# Patient Record
Sex: Male | Born: 1985
Health system: Southern US, Community
[De-identification: ages and names within clinical notes are randomized; demographics above are authoritative.]

## PROBLEM LIST (undated history)

## (undated) HISTORY — PX: APPENDECTOMY: SHX54

---

## 2012-02-20 ENCOUNTER — Emergency Department (HOSPITAL_COMMUNITY)
Admission: EM | Admit: 2012-02-20 | Discharge: 2012-02-21 | Disposition: A | Discharge status: Home / Self Care | Payer: Worker's Compensation | Attending: Emergency Medicine | Admitting: Emergency Medicine

## 2012-02-20 ENCOUNTER — Encounter (HOSPITAL_COMMUNITY): Payer: Self-pay | Admitting: Family Medicine

## 2012-02-20 DIAGNOSIS — W268XXA Contact with other sharp object(s), not elsewhere classified, initial encounter: Secondary | ICD-10-CM | POA: Insufficient documentation

## 2012-02-20 DIAGNOSIS — Z23 Encounter for immunization: Secondary | ICD-10-CM | POA: Insufficient documentation

## 2012-02-20 DIAGNOSIS — IMO0001 Reserved for inherently not codable concepts without codable children: Secondary | ICD-10-CM

## 2012-02-20 DIAGNOSIS — S61209A Unspecified open wound of unspecified finger without damage to nail, initial encounter: Secondary | ICD-10-CM | POA: Insufficient documentation

## 2012-02-20 NOTE — ED Notes (Signed)
Pt has grease on hands and area cleaned as best as possible at this time. New bandage applied. Bleeding controlled.

## 2012-02-20 NOTE — ED Notes (Signed)
Pt states he took two small ibuprofen after event happened.

## 2012-02-20 NOTE — ED Notes (Signed)
Pt reports he was at work and cut his right ring and middle fingers on a truck. Bleeding controlled at this time. Pain rated 4/10. Pt states he needs a drug screen done for work.

## 2012-02-21 ENCOUNTER — Emergency Department (HOSPITAL_COMMUNITY): Payer: Worker's Compensation

## 2012-02-21 MED ORDER — TETANUS-DIPHTH-ACELL PERTUSSIS 5-2.5-18.5 LF-MCG/0.5 IM SUSP
0.5000 mL | Freq: Once | INTRAMUSCULAR | Status: AC
  Administered 2012-02-21: 0.5 mL via INTRAMUSCULAR
  Filled 2012-02-21: qty 0.5

## 2012-02-21 MED ORDER — AMOXICILLIN-POT CLAVULANATE 875-125 MG PO TABS: 1.0000 | ORAL_TABLET | Freq: Two times a day (BID) | ORAL | Status: AC

## 2012-02-21 NOTE — ED Notes (Signed)
Patient is alert and oriented x3.  He was given DC instructions and follow up visit instructions.  Patient gave verbal understanding.  He was DC ambulatory under his own power to home.  V/S stable.  He was not showing any signs of distress on DC 

## 2012-02-21 NOTE — ED Provider Notes (Signed)
Medical screening examination/treatment/procedure(s) were performed by non-physician practitioner and as supervising physician I was immediately available for consultation/collaboration.    Vida Roller, MD 02/21/12 [phone]

## 2012-02-21 NOTE — ED Provider Notes (Signed)
History     CSN: [id number]  Arrival date & time 02/20/12  2205   First MD Initiated Contact with Patient 02/21/12 0113      Chief Complaint  Patient presents with  . Extremity Laceration    (Consider location/radiation/quality/duration/timing/severity/associated sxs/prior treatment) HPI The patient presents to the emergency department with lacerations to his third and fourth digits following an injury at work.  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  Patient denies numbness or weakness in the fingers.  Patient, states he has full range of motion and strength in his hand and fingers.  Patient denies nausea, or vomiting.  Patient did not take anything prior to arrival, for his discomfort. History reviewed. No pertinent past medical history.  Past Surgical History  Procedure Date  . Appendectomy     History reviewed. No pertinent family history.  History  Substance Use Topics  . Smoking status: Never Smoker   . Smokeless tobacco: Not on file  . Alcohol Use: Yes     occasionally      Review of Systems All other systems negative except as documented in the HPI. All pertinent positives and negatives as reviewed in the HPI.  Allergies  Review of patient's allergies indicates no known allergies.  Home Medications   Current Outpatient Rx  Name Route Sig Dispense Refill  . IBUPROFEN 200 MG PO TABS Oral Take 400 mg by mouth every 8 (eight) hours as needed. For pain      BP 113/67  Pulse 97  Temp 98.8 F (37.1 C) (Oral)  Resp 18  SpO2 98%  Physical Exam  Nursing note and vitals reviewed. Constitutional: He appears well-developed and well-nourished.  HENT:  Head: Normocephalic and atraumatic.  Pulmonary/Chest: Effort normal.  Musculoskeletal:       Right hand: He exhibits laceration. He exhibits normal range of motion, normal two-point discrimination and normal capillary refill. normal sensation noted. Normal strength  noted.       Hands: Skin: Skin is warm and dry.    ED Course  Procedures (including critical care time)  Labs Reviewed - No data to display Dg Hand Complete Right  02/21/2012  *RADIOLOGY REPORT*  Clinical Data: Hand laceration.  RIGHT HAND - COMPLETE 3+ VIEW  Comparison: None.  Findings: There is a small linear density medial to the middle phalanx third digit.  No acute fracture or dislocation otherwise. No aggressive osseous lesion.  IMPRESSION: Small linear density projecting medial to the middle phalanx third digit.  May be artifactual or a small foreign body.  Correlate with location of the injury.   Original Report Authenticated By: Waneta Martins, M.D.      LACERATION REPAIR Performed by: Carlyle Dolly Authorized by: Carlyle Dolly Consent: Verbal consent obtained. Risks and benefits: risks, benefits and alternatives were discussed Consent given by: patient Patient identity confirmed: provided demographic data Prepped and Draped in normal sterile fashion Wound explored  Laceration Location: DIP on the dorsal aspect of the third digit  Laceration Length: 2cm  No Foreign Bodies seen or palpated  Anesthesia: local infiltration  Local anesthetic: lidocaine 2 % without epinephrine  Anesthetic total: 5 ml  Irrigation method: syringe Amount of cleaning: standard  Skin closure: 4-0 Prolene   Number of sutures: 3   Technique: Simple, interrupted   Patient tolerance: Patient tolerated the procedure well with no immediate complications.  Wound was scrubbed and cleansed of dirt and grease-like material  LACERATION REPAIR Performed  by: Keiton Cosma W Authorized by: Carlyle Dolly Consent: Verbal consent obtained. Risks and benefits: risks, benefits and alternatives were discussed Consent given by: patient Patient identity confirmed: provided demographic data Prepped and Draped in normal sterile fashion Wound explored  Laceration Location:  Dorsal aspect of the DIP on the fourth digit right hand  Laceration Length: 2.5 cm  No Foreign Bodies seen or palpated  Anesthesia: local infiltration  Local anesthetic: lidocaine 2 % without epinephrine  Anesthetic total: 5 ml  Irrigation method: syringe Amount of cleaning: standard  Skin closure: 4-0 Prolene   Number of sutures: 3   Technique: Simple, interrupted   Patient tolerance: Patient tolerated the procedure well with no immediate complications.Wound was scrubbed and cleansed of dirt and grease-like material  The wounds were explored and there were some grease-like material was removed from each wound is no signs of tendon disruption or injury on exploration    MDM          Carlyle Dolly, PA-C 02/21/12 0353  Carlyle Dolly, PA-C 02/21/12 [id number]

## 2012-02-21 NOTE — ED Notes (Signed)
Wound cleaned.

## 2012-08-30 ENCOUNTER — Ambulatory Visit: Payer: Commercial Managed Care - PPO

## 2012-08-30 ENCOUNTER — Ambulatory Visit (INDEPENDENT_AMBULATORY_CARE_PROVIDER_SITE_OTHER): Payer: Commercial Managed Care - PPO | Admitting: Emergency Medicine

## 2012-08-30 DIAGNOSIS — S93409A Sprain of unspecified ligament of unspecified ankle, initial encounter: Secondary | ICD-10-CM

## 2012-08-30 MED ORDER — HYDROCODONE-ACETAMINOPHEN 5-325 MG PO TABS: 1.0000 | ORAL_TABLET | Freq: Four times a day (QID) | ORAL | Status: AC | PRN

## 2012-08-30 NOTE — Progress Notes (Signed)
  Subjective:    Patient ID: Ryan Cox, male    DOB: 1985/10/18, 27 y.o.   MRN: [id number]  HPI complains of "rolling ankle" 4 days ago. Inversion of the ankle after slipping off top step and exerting full body weight on it. Has been wearing a brace since it happened, but it has not improved. Able to bear weight, but it is painful. Also claims it pops when the ankle moves and that hurts the worst. Pain is worst at the posterior part of the later malleolus. It was swollen with bruising last night. Has been icing it. Taking ibuprofen as well, which has not helped.      Review of Systems  Constitutional: Negative.   Respiratory: Negative.   Cardiovascular: Negative.   Musculoskeletal: Positive for joint swelling, arthralgias and gait problem.       Objective:   Physical Exam  Constitutional: He appears well-developed and well-nourished.  Cardiovascular: Normal rate, regular rhythm and normal heart sounds.   Pulmonary/Chest: Effort normal and breath sounds normal.  Musculoskeletal:       Left ankle: He exhibits decreased range of motion, swelling and ecchymosis. He exhibits no deformity, no laceration and normal pulse. Achilles tendon normal.       Feet:  Tenderness around left malleolus. Mild ecchymosis below malleolus. Mild pain with inversion, eversion, and plantarflexion. Severe pain with dorsiflexion.     [facility], read by Dr. Cleta Alberts: L ankle: normal.      Assessment & Plan:  27 yo male presenting with L ankle injury. Left ankle sprain. Advise to continue wearing brace and take ibuprofen. Advise to soak in warm water with epsom salt, as well as write out alphabet with big toe. Given pain medicine for use at nighttime only. Given a work note for today, and he will hopefully be able to return Monday after resting it this weekend.

## 2012-08-30 NOTE — Patient Instructions (Addendum)
Ankle Sprain  An ankle sprain is an injury to the strong, fibrous tissues (ligaments) that hold the bones of your ankle joint together.   CAUSES  An ankle sprain is usually caused by a fall or by twisting your ankle. Ankle sprains most commonly occur when you step on the outer edge of your foot, and your ankle turns inward. People who participate in sports are more prone to these types of injuries.   SYMPTOMS    Pain in your ankle. The pain may be present at rest or only when you are trying to stand or walk.   Swelling.   Bruising. Bruising may develop immediately or within 1 to 2 days after your injury.   Difficulty standing or walking, particularly when turning corners or changing directions.  DIAGNOSIS   Your caregiver will ask you details about your injury and perform a physical exam of your ankle to determine if you have an ankle sprain. During the physical exam, your caregiver will press on and apply pressure to specific areas of your foot and ankle. Your caregiver will try to move your ankle in certain ways. An X-ray exam may be done to be sure a bone was not broken or a ligament did not separate from one of the bones in your ankle (avulsion fracture).   TREATMENT   Certain types of braces can help stabilize your ankle. Your caregiver can make a recommendation for this. Your caregiver may recommend the use of medicine for pain. If your sprain is severe, your caregiver may refer you to a surgeon who helps to restore function to parts of your skeletal system (orthopedist) or a physical therapist.  HOME CARE INSTRUCTIONS    Apply ice to your injury for 1 to 2 days or as directed by your caregiver. Applying ice helps to reduce inflammation and pain.   Put ice in a plastic bag.   Place a towel between your skin and the bag.   Leave the ice on for 15 to 20 minutes at a time, every 2 hours while you are awake.   Only take over-the-counter or prescription medicines for pain, discomfort, or fever as directed  by your caregiver.   Keep your injured leg elevated, when possible, to lessen swelling.   If your caregiver recommends crutches, use them as instructed. Gradually put weight on the affected ankle. Continue to use crutches or a cane until you can walk without feeling pain in your ankle.   If you have a plaster splint, wear the splint as directed by your caregiver. Do not rest it on anything harder than a pillow for the first 24 hours. Do not put weight on it. Do not get it wet. You may take it off to take a shower or bath.   You may have been given an elastic bandage to wear around your ankle to provide support. If the elastic bandage is too tight (you have numbness or tingling in your foot or your foot becomes cold and blue), adjust the bandage to make it comfortable.   If you have an air splint, you may blow more air into it or let air out to make it more comfortable. You may take your splint off at night and before taking a shower or bath.   Wiggle your toes in the splint several times per day to decrease swelling.  SEEK MEDICAL CARE IF:    You have an increase in bruising, swelling, or pain.   Your toes feel extremely cold   or you lose feeling in your foot.   Your pain is not relieved with medicine.  SEEK IMMEDIATE MEDICAL CARE IF:   Your toes are numb or blue.   You have severe pain.  MAKE SURE YOU:    Understand these instructions.   Will watch your condition.   Will get help right away if you are not doing well or get worse.  Document Released: 05/08/2005 Document Revised: 07/31/2011 Document Reviewed: 05/20/2011  [employer] Patient Information 2013 [employer].

## 2014-11-14 IMAGING — CR DG ANKLE COMPLETE 3+V*L*
1 series · 1 of 1 positions shown · non-contrast
Comparison: None.

CLINICAL DATA: History of injury.

LEFT ANKLE COMPLETE - 3+ VIEW

[AP]
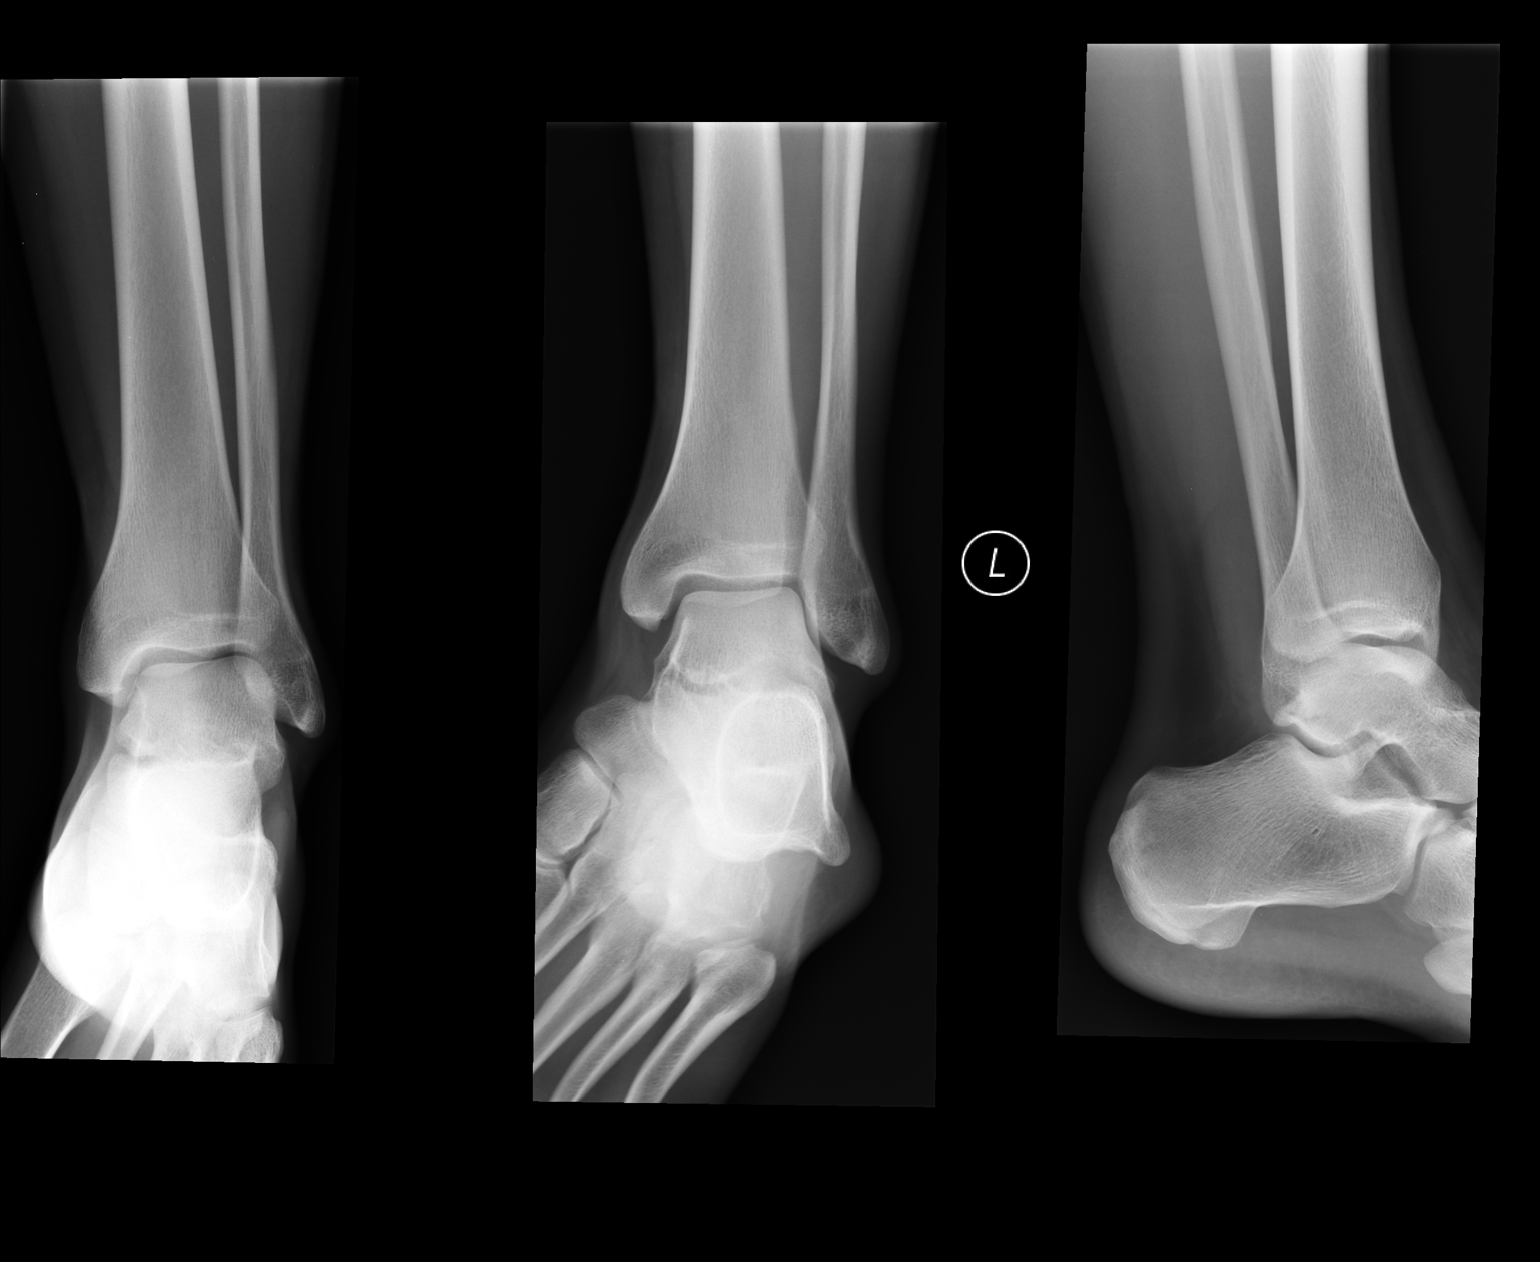

[1 of 1 positions shown; findings below may reference images not displayed]

FINDINGS: Alignment is normal.  Joint spaces are preserved.  No
fracture or dislocation is evident.  No soft tissue lesions are
seen.
IMPRESSION: No ankle abnormality is evident.

## 2019-09-07 ENCOUNTER — Other Ambulatory Visit: Payer: Self-pay

## 2019-09-07 ENCOUNTER — Encounter (HOSPITAL_COMMUNITY): Payer: Self-pay | Admitting: Emergency Medicine

## 2019-09-07 ENCOUNTER — Emergency Department (HOSPITAL_COMMUNITY): Payer: No Typology Code available for payment source

## 2019-09-07 ENCOUNTER — Emergency Department (HOSPITAL_COMMUNITY)
Admission: EM | Admit: 2019-09-07 | Discharge: 2019-09-07 | Disposition: A | Discharge status: Home / Self Care | Payer: No Typology Code available for payment source | Attending: Emergency Medicine | Admitting: Emergency Medicine

## 2019-09-07 DIAGNOSIS — R42 Dizziness and giddiness: Secondary | ICD-10-CM | POA: Insufficient documentation

## 2019-09-07 DIAGNOSIS — F17210 Nicotine dependence, cigarettes, uncomplicated: Secondary | ICD-10-CM | POA: Diagnosis not present

## 2019-09-07 DIAGNOSIS — R0602 Shortness of breath: Secondary | ICD-10-CM | POA: Diagnosis not present

## 2019-09-07 DIAGNOSIS — R0789 Other chest pain: Secondary | ICD-10-CM | POA: Insufficient documentation

## 2019-09-07 LAB — CBC
HCT: 44.6 % (ref 39.0–52.0)
Hemoglobin: 14.9 g/dL (ref 13.0–17.0)
MCH: 30.6 pg (ref 26.0–34.0)
MCHC: 33.4 g/dL (ref 30.0–36.0)
MCV: 91.6 fL (ref 80.0–100.0)
Platelets: 237 10*3/uL (ref 150–400)
RBC: 4.87 MIL/uL (ref 4.22–5.81)
RDW: 12.5 % (ref 11.5–15.5)
WBC: 6.7 10*3/uL (ref 4.0–10.5)
nRBC: 0 % (ref 0.0–0.2)

## 2019-09-07 LAB — BASIC METABOLIC PANEL
Anion gap: 9 (ref 5–15)
BUN: 15 mg/dL (ref 6–20)
CO2: 28 mmol/L (ref 22–32)
Calcium: 9.4 mg/dL (ref 8.9–10.3)
Chloride: 106 mmol/L (ref 98–111)
Creatinine, Ser: 0.87 mg/dL (ref 0.61–1.24)
GFR calc Af Amer: >60 mL/min (ref >60)
GFR calc non Af Amer: >60 mL/min (ref >60)
Glucose, Bld: 104 mg/dL — ABNORMAL HIGH (ref 70–99)
Potassium: 3.6 mmol/L (ref 3.5–5.1)
Sodium: 143 mmol/L (ref 135–145)

## 2019-09-07 LAB — TROPONIN I (HIGH SENSITIVITY)
Troponin I (High Sensitivity): <2 ng/L (ref <18)
Troponin I (High Sensitivity): <2 ng/L (ref <18)

## 2019-09-07 MED ORDER — SODIUM CHLORIDE 0.9 % IV BOLUS
1000.0000 mL | Freq: Once | INTRAVENOUS | Status: AC
  Administered 2019-09-07: 1000 mL via INTRAVENOUS

## 2019-09-07 MED ORDER — SODIUM CHLORIDE 0.9% FLUSH
3.0000 mL | Freq: Once | INTRAVENOUS | Status: AC
  Administered 2019-09-07: 3 mL via INTRAVENOUS

## 2019-09-07 NOTE — ED Provider Notes (Signed)
[facility] EMERGENCY DEPARTMENT Provider Note   CSN: [id number] Arrival date & time: 09/07/19  1424     History Chief Complaint  Patient presents with  . Chest Pain    Ryan Cox is a 34 y.o. male who presents to ED with a chief complaint of chest pain.  Yesterday morning, 5 minutes after he woke up approximately 36 hours ago started having squeezing central chest pain with associated shortness of breath.  Symptoms have been intermittent without specific aggravating or alleviating factor since it began.  He reports associated dizziness.  He denies history of similar symptoms in the past.  He has not tried any medications to help with the symptoms.  Reports normal activity and appetite level.  Denies any cough, fever, abdominal pain, vomiting, history of DVT or PE, MI, recent immobilization, leg swelling, exogenous hormone use or recent surgeries.  Does report family history of CAD in both grandparents.  HPI     History reviewed. No pertinent past medical history.  There are no problems to display for this patient.   Past Surgical History:  Procedure Laterality Date  . APPENDECTOMY         No family history on file.  Social History   Tobacco Use  . Smoking status: Current Some Day Smoker  . Smokeless tobacco: Never Used  Substance Use Topics  . Alcohol use: Yes    Comment: occasionally  . Drug use: No    Home Medications Prior to Admission medications   Medication Sig Start Date End Date Taking? Authorizing Provider  amoxicillin-clavulanate (AUGMENTIN) 875-125 MG per tablet Take 1 tablet by mouth 2 (two) times daily. 02/21/12   Lawyer, Cristal Deer, PA-C  HYDROcodone-acetaminophen (NORCO) 5-325 MG per tablet Take 1 tablet by mouth every 6 (six) hours as needed for pain. 08/30/12   Collene Gobble, MD  ibuprofen (ADVIL,MOTRIN) 200 MG tablet Take 400 mg by mouth every 8 (eight) hours as needed. For pain    [provider]    Allergies     Patient has no known allergies.  Review of Systems   Review of Systems  Constitutional: Negative for appetite change, chills and fever.  HENT: Negative for ear pain, rhinorrhea, sneezing and sore throat.   Eyes: Negative for photophobia and visual disturbance.  Respiratory: Positive for shortness of breath. Negative for cough, chest tightness and wheezing.   Cardiovascular: Positive for chest pain. Negative for palpitations.  Gastrointestinal: Negative for abdominal pain, blood in stool, constipation, diarrhea, nausea and vomiting.  Genitourinary: Negative for dysuria, hematuria and urgency.  Musculoskeletal: Negative for myalgias.  Skin: Negative for rash.  Neurological: Positive for dizziness. Negative for weakness and light-headedness.    Physical Exam Updated Vital Signs BP 104/73   Pulse (!) 53   Temp 98.7 F (37.1 C) (Oral)   Resp 12   Ht 6\' 2"  (1.88 m)   Wt 81.6 kg   SpO2 100%   BMI 23.11 kg/m   Physical Exam Vitals and nursing note reviewed.  Constitutional:      General: He is not in acute distress.    Appearance: He is well-developed.  HENT:     Head: Normocephalic and atraumatic.     Nose: Nose normal.  Eyes:     General: No scleral icterus.       Right eye: No discharge.        Left eye: No discharge.     Conjunctiva/sclera: Conjunctivae normal.  Cardiovascular:     Rate  and Rhythm: Normal rate and regular rhythm.     Heart sounds: Normal heart sounds. No murmur. No friction rub. No gallop.   Pulmonary:     Effort: Pulmonary effort is normal. No respiratory distress.     Breath sounds: Normal breath sounds.  Abdominal:     General: Bowel sounds are normal. There is no distension.     Palpations: Abdomen is soft.     Tenderness: There is no abdominal tenderness. There is no guarding.  Musculoskeletal:        General: Normal range of motion.     Cervical back: Normal range of motion and neck supple.     Right lower leg: No tenderness. No edema.      Left lower leg: No tenderness. No edema.     Comments: No lower extremity edema, erythema or calf tenderness bilaterally.  Skin:    General: Skin is warm and dry.     Findings: No rash.  Neurological:     Mental Status: He is alert. Mental status is at baseline.     Cranial Nerves: No cranial nerve deficit.     Motor: No abnormal muscle tone.     Coordination: Coordination normal.     ED Results / Procedures / Treatments   Labs (all labs ordered are listed, but only abnormal results are displayed) Labs Reviewed  BASIC METABOLIC PANEL - Abnormal; Notable for the following components:      Result Value   Glucose, Bld 104 (*)    All other components within normal limits  CBC  TROPONIN I (HIGH SENSITIVITY)  TROPONIN I (HIGH SENSITIVITY)    EKG EKG Interpretation  Date/Time:  Sunday September 07 2019 14:31:40 EDT Ventricular Rate:  69 PR Interval:  124 QRS Duration: 86 QT Interval:  356 QTC Calculation: 381 R Axis:   72 Text Interpretation: Normal sinus rhythm Normal ECG No prior ECG for comparison. No STEMI Confirmed by Tegeler, Chris ([id number]) on 09/07/2019 5:41:34 PM   Radiology DG Chest 2 View  Result Date: 09/07/2019 CLINICAL DATA:  33 year old male with chest pain. EXAM: CHEST - 2 VIEW COMPARISON:  None. FINDINGS: The heart size and mediastinal contours are within normal limits. Both lungs are clear. The visualized skeletal structures are unremarkable. IMPRESSION: No active cardiopulmonary disease. Electronically Signed   By: Arash  Radparvar M.D.   On: 09/07/2019 15:04    Procedures Procedures (including critical care time)  Medications Ordered in ED Medications  sodium chloride flush (NS) 0.9 % injection 3 mL (3 mLs Intravenous Given 09/07/19 1839)  sodium chloride 0.9 % bolus 1,000 mL (1,000 mLs Intravenous New Bag/Given 09/07/19 1838)    ED Course  I have reviewed the triage vital signs and the nursing notes.  Pertinent labs & imaging results that were available  during my care of the patient were reviewed by me and considered in my medical decision making (see chart for details).    MDM Rules/Calculators/A&P                      33  year old male presents to ED with a chief complaint of chest pain.  Yesterday morning, 5 minutes after he woke up approximately 36 hours ago started having squeezing central chest pain with associated shortness of breath and dizziness.  Symptoms have been intermittent without specific aggravating or alleviating factor.  Denies cough, fever, abdominal pain, vomiting, leg swelling, history of DVT or PE, recent immobilization.  On exam patient is overall well-appearing.  He is not experiencing pain on my evaluation.  No lower extremity edema, erythema or calf tenderness that concern me for DVT.  He is not tachycardic, tachypneic or hypoxic.  EKG shows normal sinus rhythm, no ischemic changes.  Chest x-ray is unremarkable.  BMP, CBC and serial troponins are unremarkable.  Patient was given IV fluids here.  At this time I have low suspicion for ACS as a cause of his symptoms based on today's work-up.  He is PERC negative so I doubt PE.  No evidence of pneumonia, pneumothorax on his exam.  As he is pain-free on my exam, no interventions were done.  I had a discussion with the patient and family member at bedside.  States that they are concerned about his dizziness.  Suspect that this could be due to dehydration versus anxiety.  Informed him that there are no signs of severe dehydration on today's lab work.  I did offer additional work-up including imaging or treatment with medication such as meclizine but they declined.  I doubt his dizziness is due to an acute neurological cause such as CVA or dissection. Encourage patient to follow-up with PCP and to return for worsening symptoms.  All imaging, if done today, including plain films, CT scans, and ultrasounds, independently reviewed by me, and interpretations confirmed via formal radiology  reads.  Patient is hemodynamically stable, in NAD, and able to ambulate in the ED. Evaluation does not show pathology that would require ongoing emergent intervention or inpatient treatment. I explained the diagnosis to the patient. Pain has been managed and has no complaints prior to discharge. Patient is comfortable with above plan and is stable for discharge at this time. All questions were answered prior to disposition. Strict return precautions for returning to the ED were discussed. Encouraged follow up with PCP.   An After Visit Summary was printed and given to the patient.   Portions of this note were generated with [employer]. Dictation errors may occur despite best attempts at proofreading.   Final Clinical Impression(s) / ED Diagnoses Final diagnoses:  Chest wall pain    Rx / DC Orders ED Discharge Orders    None       Delia Heady, PA-C 09/07/19 2027    Tegeler, Gwenyth Allegra, MD 09/08/19 [phone]

## 2019-09-07 NOTE — Discharge Instructions (Addendum)
Your work-up today including your EKG, heart enzymes, blood counts electrolytes were normal.  Your chest x-ray did not show any concerning findings. We are unable to explain why you are having the symptoms but were able to rule out life threatening causes. You will need to follow-up with your primary care provider. Return to the ED if you start to experience worsening chest pain, shortness of breath, leg swelling, injuries or falls or lightheadedness.

## 2019-09-07 NOTE — ED Triage Notes (Signed)
C/o squeezing pain to center of chest since yesterday with SOB and dizziness.

## 2019-11-11 ENCOUNTER — Other Ambulatory Visit (HOSPITAL_COMMUNITY): Payer: Self-pay | Admitting: Internal Medicine

## 2019-11-11 MED FILL — ESCITALOPRAM 10 MG TABLET: 10 | 90 days supply | Qty: 90 | Fill #0

## 2019-11-21 MED FILL — ESCITALOPRAM 10 MG TABLET: 10 | 90 days supply | Qty: 90 | Fill #0

## 2020-02-28 MED FILL — ESCITALOPRAM 10 MG TABLET: 10 | 90 days supply | Qty: 90 | Fill #1

## 2020-06-09 MED FILL — ESCITALOPRAM 10 MG TABLET: 10 | 90 days supply | Qty: 90 | Fill #2

## 2020-09-18 ENCOUNTER — Other Ambulatory Visit (HOSPITAL_COMMUNITY): Payer: Self-pay

## 2020-09-18 MED FILL — Escitalopram Oxalate Tab 10 MG (Base Equiv): ORAL | 90 days supply | Qty: 90 | Fill #0 | Status: AC

## 2021-06-07 ENCOUNTER — Other Ambulatory Visit (HOSPITAL_COMMUNITY): Payer: Self-pay

## 2021-06-07 MED ORDER — VENLAFAXINE HCL ER 75 MG PO CP24
75.0000 mg | ORAL_CAPSULE | Freq: Every day | ORAL | 1 refills | Status: DC
  Filled 2021-06-07 – 2021-06-20 (×2): qty 90, 90d supply, fill #0
  Filled 2021-09-29: qty 90, 90d supply, fill #1

## 2021-06-11 ENCOUNTER — Other Ambulatory Visit (HOSPITAL_COMMUNITY): Payer: Self-pay

## 2021-06-20 ENCOUNTER — Other Ambulatory Visit (HOSPITAL_COMMUNITY): Payer: Self-pay

## 2021-09-29 ENCOUNTER — Other Ambulatory Visit (HOSPITAL_COMMUNITY): Payer: Self-pay

## 2021-10-21 ENCOUNTER — Other Ambulatory Visit (HOSPITAL_COMMUNITY): Payer: Self-pay

## 2021-10-21 MED ORDER — VENLAFAXINE HCL ER 75 MG PO CP24
75.0000 mg | ORAL_CAPSULE | Freq: Every day | ORAL | 3 refills | Status: AC
  Filled 2021-10-21 – 2022-01-02 (×2): qty 90, 90d supply, fill #0
  Filled 2022-04-05: qty 90, 90d supply, fill #1
  Filled 2022-06-30: qty 90, 90d supply, fill #2
  Filled 2022-10-04: qty 90, 90d supply, fill #3

## 2021-11-21 IMAGING — DX DG CHEST 2V
2 series · 2 of 2 positions shown · non-contrast
Comparison: None.

CLINICAL DATA: 33-year-old male with chest pain.

EXAM:
CHEST - 2 VIEW

[chest pa]
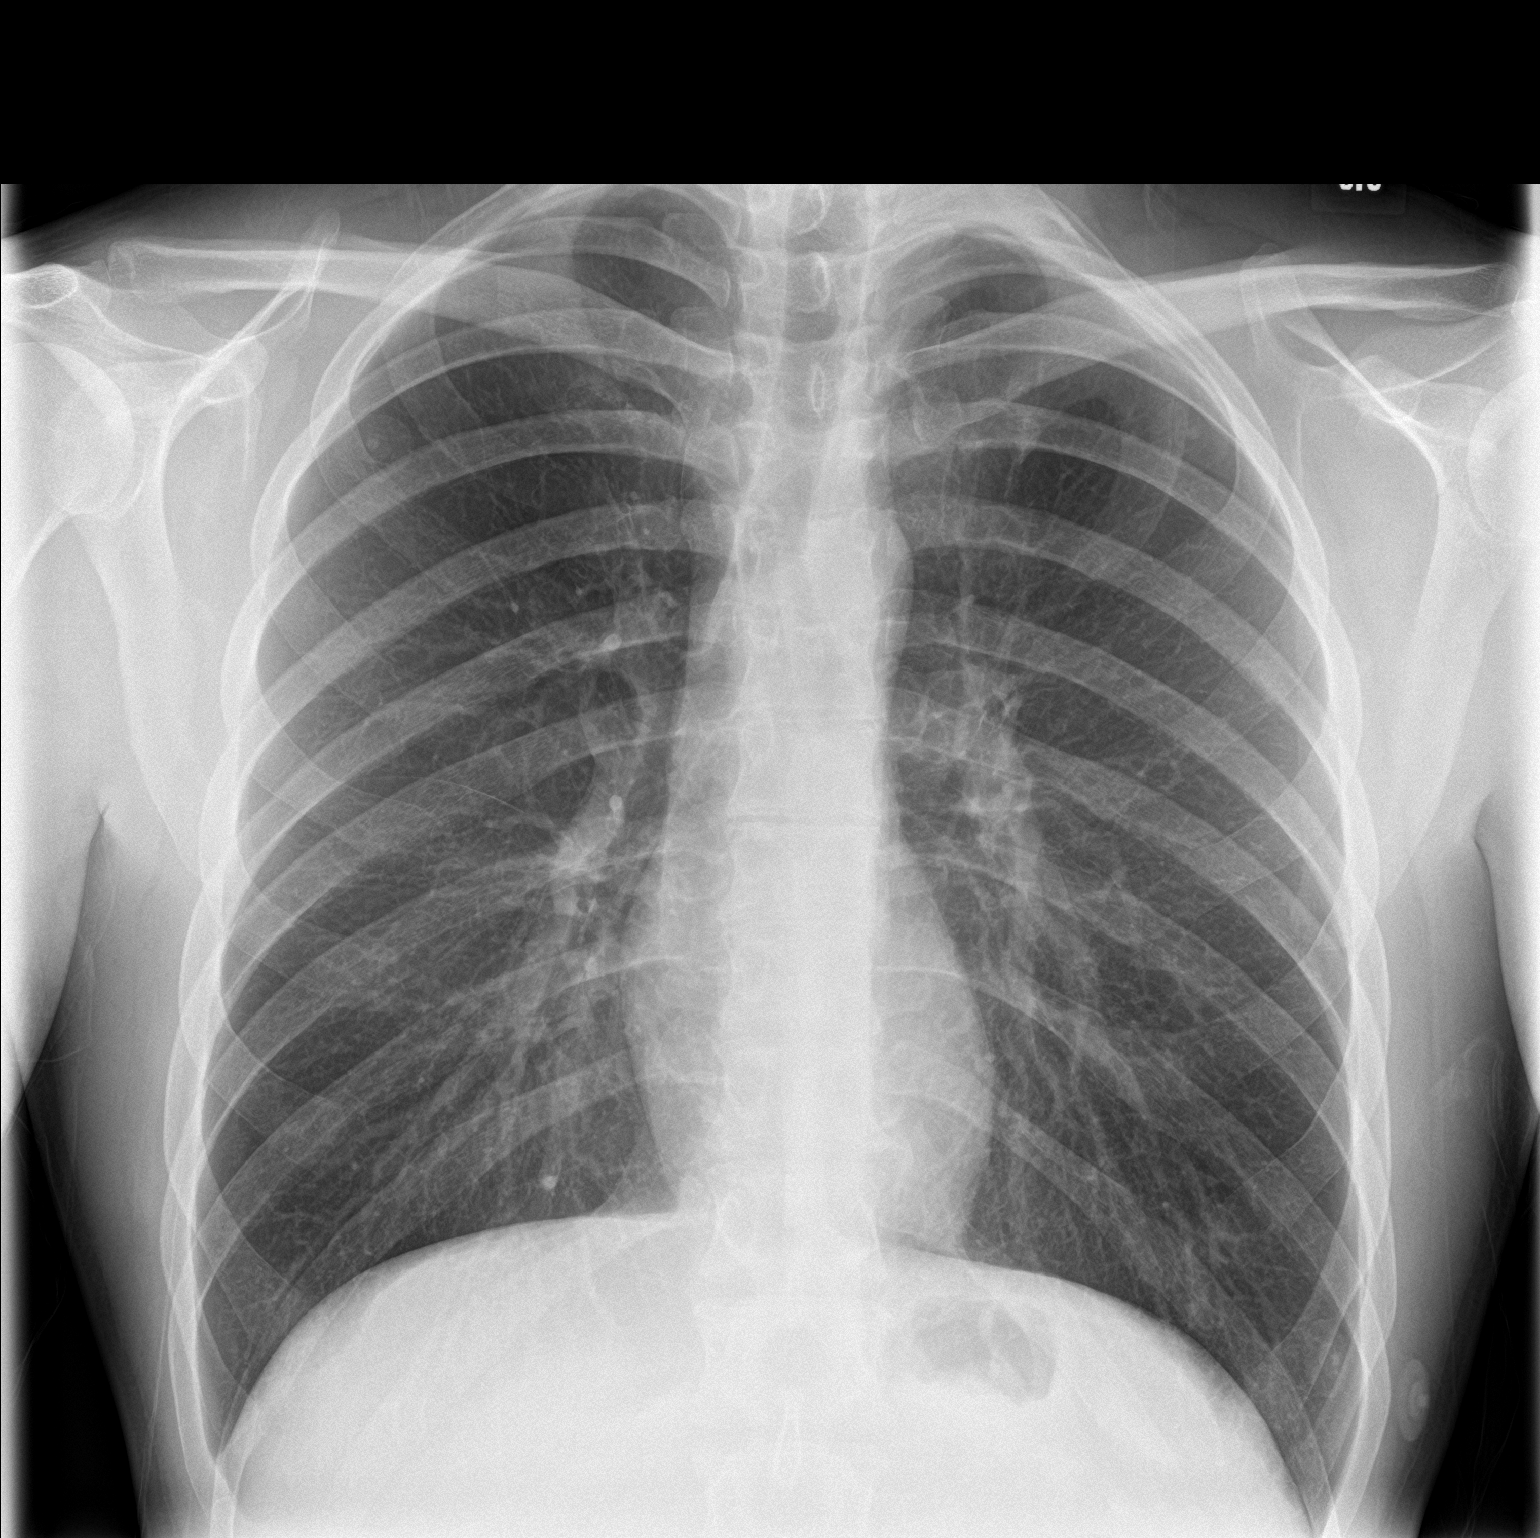

[chest lat]
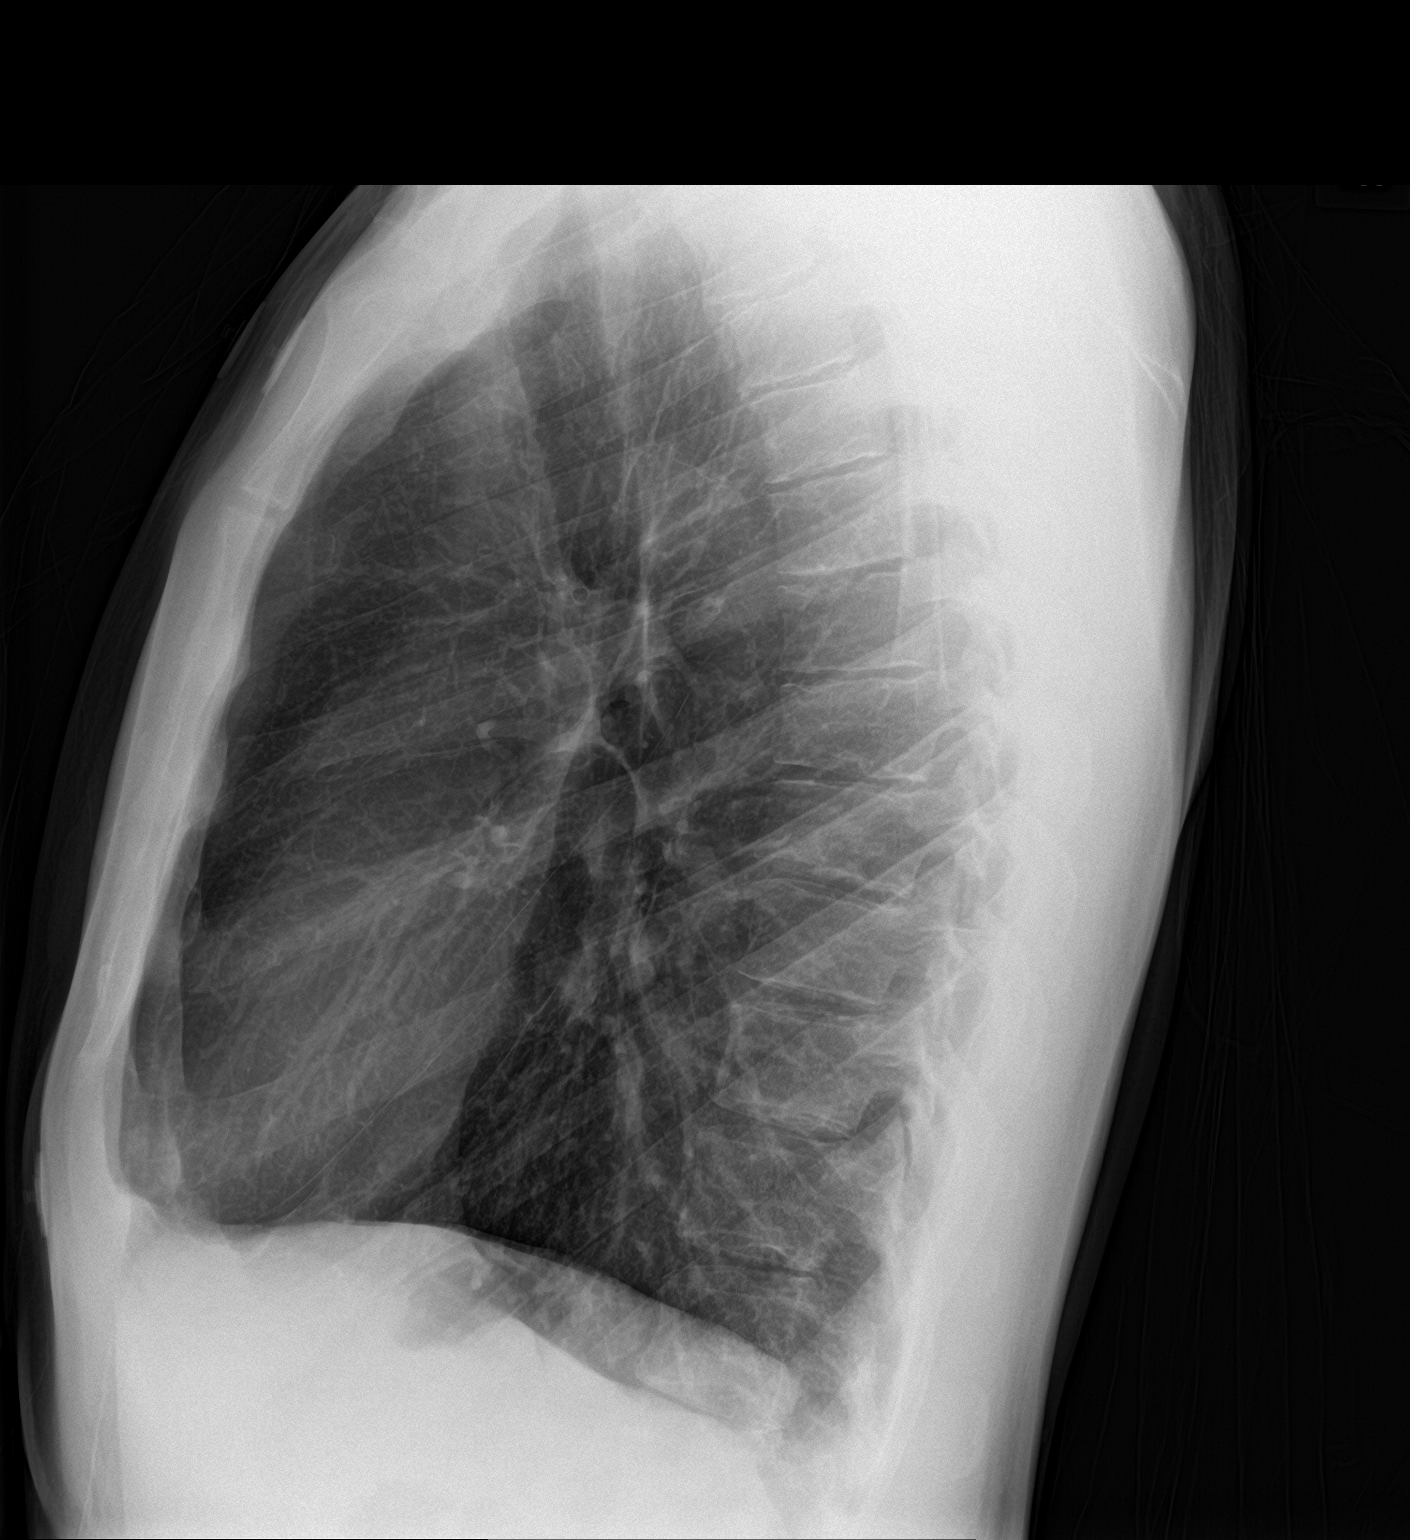

[2 of 2 positions shown; findings below may reference images not displayed]

FINDINGS: The heart size and mediastinal contours are within normal limits.
Both lungs are clear. The visualized skeletal structures are
unremarkable.
IMPRESSION: No active cardiopulmonary disease.

## 2022-01-02 ENCOUNTER — Other Ambulatory Visit (HOSPITAL_COMMUNITY): Payer: Self-pay

## 2022-04-05 ENCOUNTER — Other Ambulatory Visit (HOSPITAL_COMMUNITY): Payer: Self-pay

## 2022-06-30 ENCOUNTER — Other Ambulatory Visit (HOSPITAL_COMMUNITY): Payer: Self-pay

## 2022-10-04 ENCOUNTER — Encounter: Payer: Self-pay | Admitting: Pharmacist

## 2022-10-04 ENCOUNTER — Other Ambulatory Visit: Payer: Self-pay

## 2022-10-04 ENCOUNTER — Other Ambulatory Visit (HOSPITAL_COMMUNITY): Payer: Self-pay

## 2023-02-01 ENCOUNTER — Other Ambulatory Visit (HOSPITAL_COMMUNITY): Payer: Self-pay

## 2023-02-01 DIAGNOSIS — Z6825 Body mass index (BMI) 25.0-25.9, adult: Secondary | ICD-10-CM | POA: Diagnosis not present

## 2023-02-01 DIAGNOSIS — G44009 Cluster headache syndrome, unspecified, not intractable: Secondary | ICD-10-CM | POA: Diagnosis not present

## 2023-02-01 DIAGNOSIS — Z1331 Encounter for screening for depression: Secondary | ICD-10-CM | POA: Diagnosis not present

## 2023-02-01 DIAGNOSIS — F4322 Adjustment disorder with anxiety: Secondary | ICD-10-CM | POA: Diagnosis not present

## 2023-02-01 DIAGNOSIS — Z1339 Encounter for screening examination for other mental health and behavioral disorders: Secondary | ICD-10-CM | POA: Diagnosis not present

## 2023-02-01 MED ORDER — VENLAFAXINE HCL ER 75 MG PO CP24
75.0000 mg | ORAL_CAPSULE | Freq: Every day | ORAL | 3 refills | Status: AC
  Filled 2023-02-01: qty 30, 30d supply, fill #0

## 2023-02-01 MED ORDER — VENLAFAXINE HCL ER 75 MG PO CP24
75.0000 mg | ORAL_CAPSULE | Freq: Every day | ORAL | 1 refills | Status: AC
  Filled 2023-02-01: qty 90, 90d supply, fill #0
  Filled 2023-04-28: qty 90, 90d supply, fill #1

## 2023-02-02 ENCOUNTER — Other Ambulatory Visit (HOSPITAL_BASED_OUTPATIENT_CLINIC_OR_DEPARTMENT_OTHER): Payer: Self-pay

## 2023-02-02 ENCOUNTER — Other Ambulatory Visit (HOSPITAL_COMMUNITY): Payer: Self-pay

## 2023-02-02 ENCOUNTER — Other Ambulatory Visit: Payer: Self-pay

## 2023-04-30 ENCOUNTER — Other Ambulatory Visit: Payer: Self-pay

## 2023-09-05 ENCOUNTER — Other Ambulatory Visit (HOSPITAL_COMMUNITY): Payer: Self-pay

## 2023-09-05 ENCOUNTER — Other Ambulatory Visit: Payer: Self-pay

## 2023-09-05 MED ORDER — VENLAFAXINE HCL ER 75 MG PO CP24
75.0000 mg | ORAL_CAPSULE | Freq: Every day | ORAL | 0 refills | Status: AC
  Filled 2023-09-05: qty 30, 30d supply, fill #0
